# Patient Record
Sex: Male | Born: 1935 | Hispanic: Yes | Smoking: Never smoker
Health system: Southern US, Community
[De-identification: ages and names within clinical notes are randomized; demographics above are authoritative.]

## PROBLEM LIST (undated history)

## (undated) DIAGNOSIS — I2510 Atherosclerotic heart disease of native coronary artery without angina pectoris: Secondary | ICD-10-CM

## (undated) DIAGNOSIS — N189 Chronic kidney disease, unspecified: Secondary | ICD-10-CM

## (undated) DIAGNOSIS — F329 Major depressive disorder, single episode, unspecified: Secondary | ICD-10-CM

## (undated) DIAGNOSIS — I10 Essential (primary) hypertension: Secondary | ICD-10-CM

## (undated) DIAGNOSIS — F32A Depression, unspecified: Secondary | ICD-10-CM

## (undated) HISTORY — PX: BRAIN SURGERY: SHX531

---

## 2007-03-28 ENCOUNTER — Encounter: Payer: Self-pay | Admitting: Family Medicine

## 2007-04-26 ENCOUNTER — Encounter: Payer: Self-pay | Admitting: Family Medicine

## 2007-07-15 ENCOUNTER — Emergency Department: Payer: Self-pay | Admitting: Emergency Medicine

## 2007-07-24 ENCOUNTER — Emergency Department: Payer: Self-pay | Admitting: Urology

## 2008-06-24 ENCOUNTER — Emergency Department: Payer: Self-pay | Admitting: Emergency Medicine

## 2008-07-21 ENCOUNTER — Encounter: Payer: Self-pay | Admitting: Family Medicine

## 2008-07-24 ENCOUNTER — Encounter: Payer: Self-pay | Admitting: Family Medicine

## 2008-08-23 ENCOUNTER — Encounter: Payer: Self-pay | Admitting: Family Medicine

## 2008-09-23 ENCOUNTER — Encounter: Payer: Self-pay | Admitting: Family Medicine

## 2009-05-27 ENCOUNTER — Ambulatory Visit: Payer: Self-pay | Admitting: Family Medicine

## 2010-09-28 ENCOUNTER — Encounter: Payer: Self-pay | Admitting: Family Medicine

## 2014-01-27 ENCOUNTER — Ambulatory Visit: Payer: Self-pay | Admitting: Physician Assistant

## 2014-03-07 ENCOUNTER — Ambulatory Visit: Payer: Self-pay | Admitting: Physician Assistant

## 2015-06-11 ENCOUNTER — Other Ambulatory Visit: Payer: Self-pay | Admitting: Urology

## 2015-06-11 DIAGNOSIS — R339 Retention of urine, unspecified: Secondary | ICD-10-CM

## 2015-06-16 ENCOUNTER — Other Ambulatory Visit: Payer: Self-pay | Admitting: Radiology

## 2015-06-17 ENCOUNTER — Ambulatory Visit
Admission: RE | Admit: 2015-06-17 | Discharge: 2015-06-17 | Disposition: A | Discharge status: Home / Self Care | Payer: Medicare Other | Source: Ambulatory Visit | Attending: Urology | Admitting: Urology

## 2015-06-17 DIAGNOSIS — R339 Retention of urine, unspecified: Secondary | ICD-10-CM

## 2015-06-17 DIAGNOSIS — Z01812 Encounter for preprocedural laboratory examination: Secondary | ICD-10-CM | POA: Diagnosis present

## 2015-06-17 DIAGNOSIS — I129 Hypertensive chronic kidney disease with stage 1 through stage 4 chronic kidney disease, or unspecified chronic kidney disease: Secondary | ICD-10-CM | POA: Insufficient documentation

## 2015-06-17 DIAGNOSIS — I2510 Atherosclerotic heart disease of native coronary artery without angina pectoris: Secondary | ICD-10-CM | POA: Insufficient documentation

## 2015-06-17 DIAGNOSIS — N189 Chronic kidney disease, unspecified: Secondary | ICD-10-CM | POA: Diagnosis not present

## 2015-06-17 DIAGNOSIS — Z7982 Long term (current) use of aspirin: Secondary | ICD-10-CM | POA: Insufficient documentation

## 2015-06-17 DIAGNOSIS — F329 Major depressive disorder, single episode, unspecified: Secondary | ICD-10-CM | POA: Diagnosis not present

## 2015-06-17 HISTORY — DX: Essential (primary) hypertension: I10

## 2015-06-17 HISTORY — DX: Major depressive disorder, single episode, unspecified: F32.9

## 2015-06-17 HISTORY — DX: Atherosclerotic heart disease of native coronary artery without angina pectoris: I25.10

## 2015-06-17 HISTORY — DX: Depression, unspecified: F32.A

## 2015-06-17 HISTORY — DX: Chronic kidney disease, unspecified: N18.9

## 2015-06-17 LAB — BASIC METABOLIC PANEL
Anion gap: 6 (ref 5–15)
BUN: 24 mg/dL — AB (ref 6–20)
CO2: 27 mmol/L (ref 22–32)
Calcium: 7.7 mg/dL — ABNORMAL LOW (ref 8.9–10.3)
Chloride: 112 mmol/L — ABNORMAL HIGH (ref 101–111)
Creatinine, Ser: 0.82 mg/dL (ref 0.61–1.24)
GFR calc Af Amer: >60 mL/min (ref >60)
Glucose, Bld: 91 mg/dL (ref 65–99)
POTASSIUM: 3.4 mmol/L — AB (ref 3.5–5.1)
SODIUM: 145 mmol/L (ref 135–145)

## 2015-06-17 LAB — PROTIME-INR
INR: 1.36
Prothrombin Time: 16.9 seconds — ABNORMAL HIGH (ref 11.4–15.0)

## 2015-06-17 LAB — CBC
HEMATOCRIT: 35.8 % — AB (ref 40.0–52.0)
Hemoglobin: 11.5 g/dL — ABNORMAL LOW (ref 13.0–18.0)
MCH: 28.5 pg (ref 26.0–34.0)
MCHC: 32.1 g/dL (ref 32.0–36.0)
MCV: 88.7 fL (ref 80.0–100.0)
PLATELETS: 143 10*3/uL — AB (ref 150–440)
RBC: 4.04 MIL/uL — ABNORMAL LOW (ref 4.40–5.90)
RDW: 16 % — AB (ref 11.5–14.5)
WBC: 6.9 10*3/uL (ref 3.8–10.6)

## 2015-06-17 LAB — APTT: APTT: 38 s — AB (ref 24–36)

## 2015-06-17 MED ORDER — CIPROFLOXACIN IN D5W 400 MG/200ML IV SOLN
INTRAVENOUS | Status: AC
  Filled 2015-06-17: qty 200

## 2015-06-17 MED ORDER — MIDAZOLAM HCL 5 MG/5ML IJ SOLN
INTRAMUSCULAR | Status: AC
  Filled 2015-06-17: qty 5

## 2015-06-17 MED ORDER — SODIUM CHLORIDE 0.9 % IV SOLN
Freq: Once | INTRAVENOUS | Status: AC
  Administered 2015-06-17: 10:00:00 via INTRAVENOUS

## 2015-06-17 MED ORDER — FENTANYL CITRATE (PF) 100 MCG/2ML IJ SOLN
INTRAMUSCULAR | Status: AC
  Filled 2015-06-17: qty 4

## 2015-06-17 MED ORDER — CIPROFLOXACIN IN D5W 400 MG/200ML IV SOLN
400.0000 mg | Freq: Two times a day (BID) | INTRAVENOUS | Status: DC
  Administered 2015-06-17: 400 mg via INTRAVENOUS

## 2015-06-17 NOTE — Progress Notes (Signed)
Foley D/Ced after return to [facility] per order Dr. Lowella Dandy.

## 2015-06-17 NOTE — H&P (Signed)
Chief Complaint: Patient was seen in consultation today for suprapubic catheter placement at the request of Stoioff,Scott C  Referring Physician(s): Stoioff,Scott C  History of Present Illness: Gary Fisher is a 82 y.o. male with chronic urinary retention.  Patient is non verbal and accompanied by his son.  A translator was present in order to communicate with patient's son. According the son, patient has problems with urinary infections.  Patient currently has a Foley catheter and Urology has requested a suprapubic catheter.  No current problems or complaints from patient according to son.  Patient barely responds to verbal or physical interaction.  Past Medical History  Diagnosis Date  . Depression   . Hypertension   . Coronary artery disease   . Chronic kidney disease     Past Surgical History  Procedure Laterality Date  . Brain surgery      Allergies: Review of patient's allergies indicates not on file.  Medications: Prior to Admission medications   Medication Sig Start Date End Date Taking? Authorizing Provider  acetaminophen (TYLENOL) 325 MG tablet Take 650 mg by mouth every 6 (six) hours as needed for mild pain or moderate pain.   Yes Historical Provider, MD  aspirin EC 81 MG tablet Take 81 mg by mouth daily.   Yes Historical Provider, MD  citalopram (CELEXA) 20 MG tablet Take 20 mg by mouth daily.   Yes Historical Provider, MD  isosorbide mononitrate (IMDUR) 30 MG 24 hr tablet Take 15 mg by mouth daily.   Yes Historical Provider, MD  metoprolol succinate (TOPROL-XL) 25 MG 24 hr tablet Take 12.5 mg by mouth daily.   Yes Historical Provider, MD  tamsulosin (FLOMAX) 0.4 MG CAPS capsule Take 0.4 mg by mouth at bedtime.   Yes Historical Provider, MD  nitroGLYCERIN (NITROSTAT) 0.4 MG SL tablet Place 0.4 mg under the tongue every 5 (five) minutes as needed for chest pain. Reported on 06/17/2015    Historical Provider, MD     History reviewed. No pertinent family  history.  Social History   Social History  . Marital Status: Married    Spouse Name: N/A  . Number of Children: N/A  . Years of Education: N/A   Social History Main Topics  . Smoking status: Never Smoker   . Smokeless tobacco: None  . Alcohol Use: No  . Drug Use: No  . Sexual Activity: No   Other Topics Concern  . None   Social History Narrative  . None        Review of Systems  Constitutional: Negative for fever and chills.    Vital Signs: BP 128/71 mmHg  Pulse 68  Resp 16  Ht  (1.575 m)  Wt 120 lb (54.432 kg)  BMI 21.94 kg/m2  SpO2 95%  Physical Exam  Constitutional:  Non verbal.   Minimal response from physical or verbal interaction.  Cardiovascular: Normal rate and regular rhythm.   Pulmonary/Chest: Effort normal.  Faint breath sounds.  Abdominal: Soft. There is no tenderness.  Genitourinary:  Foley intact.    Mallampati Score:  MD Evaluation Airway: WNL Heart: WNL Abdomen: WNL Chest/ Lungs: WNL ASA  Classification: 2 Mallampati/Airway Score: One  Imaging: No results found.  Labs:  CBC: No results for input(s): WBC, HGB, HCT, PLT in the last 8760 hours.  COAGS: No results for input(s): INR, APTT in the last 8760 hours.  BMP: No results for input(s): NA, K, CL, CO2, GLUCOSE, BUN, CALCIUM, CREATININE, GFRNONAA, GFRAA in the last 8760  hours.  Invalid input(s): CMP  LIVER FUNCTION TESTS: No results for input(s): BILITOT, AST, ALT, ALKPHOS, PROT, ALBUMIN in the last 8760 hours.  TUMOR MARKERS: No results for input(s): AFPTM, CEA, CA199, CHROMGRNA in the last 8760 hours.  Assessment and Plan:  82 yo with chronic urinary retention.  Request for suprapubic catheter.  Discussed CT guided tube placement with son using a Nurse, learning disability.  Son understands that this procedure may be technically difficult due to patient's condition.  The foley catheter was clamped upon arrival but may need to instill sterile fluid in bladder in order to  successfully place a suprapubic catheter.  Informed consent obtained from son.  Waiting on labs.  Plan for suprapubic catheter placement in CT with moderate sedation.    Electronically Signed: Abundio Miu 06/17/2015, 10:10 AM

## 2015-06-17 NOTE — Procedures (Signed)
Placement of suprapubic catheter with CT guidance.  120 ml of sterile water was instilled through Foley in order to distend bladder.  12 French catheter placed.  Minimal blood loss and no immediate complication.

## 2015-07-01 ENCOUNTER — Other Ambulatory Visit: Payer: Self-pay | Admitting: Urology

## 2015-07-01 DIAGNOSIS — R339 Retention of urine, unspecified: Secondary | ICD-10-CM

## 2015-07-15 ENCOUNTER — Other Ambulatory Visit: Payer: Self-pay | Admitting: Urology

## 2015-07-15 ENCOUNTER — Ambulatory Visit
Admission: RE | Admit: 2015-07-15 | Discharge: 2015-07-15 | Disposition: A | Discharge status: Home / Self Care | Payer: Medicare Other | Source: Ambulatory Visit | Attending: Urology | Admitting: Urology

## 2015-07-15 DIAGNOSIS — R339 Retention of urine, unspecified: Secondary | ICD-10-CM | POA: Insufficient documentation

## 2015-07-15 DIAGNOSIS — I129 Hypertensive chronic kidney disease with stage 1 through stage 4 chronic kidney disease, or unspecified chronic kidney disease: Secondary | ICD-10-CM | POA: Diagnosis not present

## 2015-07-15 DIAGNOSIS — F329 Major depressive disorder, single episode, unspecified: Secondary | ICD-10-CM | POA: Diagnosis not present

## 2015-07-15 DIAGNOSIS — I2510 Atherosclerotic heart disease of native coronary artery without angina pectoris: Secondary | ICD-10-CM | POA: Diagnosis not present

## 2015-07-15 DIAGNOSIS — N189 Chronic kidney disease, unspecified: Secondary | ICD-10-CM | POA: Insufficient documentation

## 2015-07-15 DIAGNOSIS — Z79899 Other long term (current) drug therapy: Secondary | ICD-10-CM | POA: Diagnosis not present

## 2015-07-15 MED ORDER — LIDOCAINE HCL (PF) 1 % IJ SOLN
INTRAMUSCULAR | Status: DC | PRN
  Administered 2015-07-15: 4 mL

## 2015-07-15 MED ORDER — IOHEXOL 300 MG/ML  SOLN
30.0000 mL | Freq: Once | INTRAMUSCULAR | Status: AC | PRN
  Administered 2015-07-15: 4 mL

## 2015-07-15 NOTE — Sedation Documentation (Signed)
#  16 multipurpose drain inserted

## 2015-08-14 ENCOUNTER — Ambulatory Visit
Admission: RE | Admit: 2015-08-14 | Discharge: 2015-08-14 | Disposition: A | Discharge status: Home / Self Care | Payer: Medicare Other | Source: Ambulatory Visit | Attending: Urology | Admitting: Urology

## 2015-08-14 NOTE — OR Nursing (Signed)
Attempted to call to find out why not here. No answer.

## 2015-08-23 ENCOUNTER — Inpatient Hospital Stay
Admission: EM | Admit: 2015-08-23 | Discharge: 2015-08-25 | DRG: 640 | Payer: Medicare Other | Attending: Internal Medicine | Admitting: Internal Medicine

## 2015-08-23 DIAGNOSIS — T83028A Displacement of other indwelling urethral catheter, initial encounter: Secondary | ICD-10-CM | POA: Diagnosis present

## 2015-08-23 DIAGNOSIS — I2510 Atherosclerotic heart disease of native coronary artery without angina pectoris: Secondary | ICD-10-CM | POA: Diagnosis present

## 2015-08-23 DIAGNOSIS — R338 Other retention of urine: Secondary | ICD-10-CM

## 2015-08-23 DIAGNOSIS — N390 Urinary tract infection, site not specified: Secondary | ICD-10-CM | POA: Diagnosis present

## 2015-08-23 DIAGNOSIS — R1310 Dysphagia, unspecified: Secondary | ICD-10-CM | POA: Diagnosis present

## 2015-08-23 DIAGNOSIS — L8990 Pressure ulcer of unspecified site, unspecified stage: Secondary | ICD-10-CM | POA: Diagnosis present

## 2015-08-23 DIAGNOSIS — Z66 Do not resuscitate: Secondary | ICD-10-CM | POA: Diagnosis present

## 2015-08-23 DIAGNOSIS — Z681 Body mass index (BMI) 19 or less, adult: Secondary | ICD-10-CM | POA: Diagnosis not present

## 2015-08-23 DIAGNOSIS — Z7982 Long term (current) use of aspirin: Secondary | ICD-10-CM | POA: Diagnosis not present

## 2015-08-23 DIAGNOSIS — E43 Unspecified severe protein-calorie malnutrition: Secondary | ICD-10-CM | POA: Diagnosis present

## 2015-08-23 DIAGNOSIS — N189 Chronic kidney disease, unspecified: Secondary | ICD-10-CM | POA: Diagnosis present

## 2015-08-23 DIAGNOSIS — E870 Hyperosmolality and hypernatremia: Secondary | ICD-10-CM | POA: Diagnosis present

## 2015-08-23 DIAGNOSIS — Z79899 Other long term (current) drug therapy: Secondary | ICD-10-CM | POA: Diagnosis not present

## 2015-08-23 DIAGNOSIS — D696 Thrombocytopenia, unspecified: Secondary | ICD-10-CM | POA: Diagnosis present

## 2015-08-23 DIAGNOSIS — Z515 Encounter for palliative care: Secondary | ICD-10-CM | POA: Diagnosis present

## 2015-08-23 DIAGNOSIS — M24561 Contracture, right knee: Secondary | ICD-10-CM | POA: Diagnosis present

## 2015-08-23 DIAGNOSIS — I129 Hypertensive chronic kidney disease with stage 1 through stage 4 chronic kidney disease, or unspecified chronic kidney disease: Secondary | ICD-10-CM | POA: Diagnosis present

## 2015-08-23 DIAGNOSIS — L97529 Non-pressure chronic ulcer of other part of left foot with unspecified severity: Secondary | ICD-10-CM | POA: Diagnosis present

## 2015-08-23 DIAGNOSIS — F03918 Unspecified dementia, unspecified severity, with other behavioral disturbance: Secondary | ICD-10-CM

## 2015-08-23 DIAGNOSIS — R4701 Aphasia: Secondary | ICD-10-CM | POA: Diagnosis present

## 2015-08-23 DIAGNOSIS — I959 Hypotension, unspecified: Secondary | ICD-10-CM | POA: Diagnosis present

## 2015-08-23 DIAGNOSIS — R627 Adult failure to thrive: Secondary | ICD-10-CM | POA: Diagnosis present

## 2015-08-23 DIAGNOSIS — R6251 Failure to thrive (child): Secondary | ICD-10-CM

## 2015-08-23 DIAGNOSIS — R339 Retention of urine, unspecified: Secondary | ICD-10-CM

## 2015-08-23 DIAGNOSIS — F0391 Unspecified dementia with behavioral disturbance: Secondary | ICD-10-CM | POA: Diagnosis present

## 2015-08-23 DIAGNOSIS — M24562 Contracture, left knee: Secondary | ICD-10-CM | POA: Diagnosis present

## 2015-08-23 DIAGNOSIS — E860 Dehydration: Secondary | ICD-10-CM | POA: Diagnosis present

## 2015-08-23 DIAGNOSIS — R532 Functional quadriplegia: Secondary | ICD-10-CM | POA: Diagnosis present

## 2015-08-23 DIAGNOSIS — Z9889 Other specified postprocedural states: Secondary | ICD-10-CM | POA: Diagnosis not present

## 2015-08-23 LAB — COMPREHENSIVE METABOLIC PANEL
ALBUMIN: 2.8 g/dL — AB (ref 3.5–5.0)
ALT: 14 U/L — ABNORMAL LOW (ref 17–63)
ANION GAP: 10 (ref 5–15)
AST: 31 U/L (ref 15–41)
Alkaline Phosphatase: 73 U/L (ref 38–126)
BUN: 46 mg/dL — ABNORMAL HIGH (ref 6–20)
CO2: 25 mmol/L (ref 22–32)
Calcium: 8.9 mg/dL (ref 8.9–10.3)
Chloride: 121 mmol/L — ABNORMAL HIGH (ref 101–111)
Creatinine, Ser: 1.16 mg/dL (ref 0.61–1.24)
GFR calc Af Amer: 59 mL/min — ABNORMAL LOW (ref >60)
GFR calc non Af Amer: 51 mL/min — ABNORMAL LOW (ref >60)
GLUCOSE: 92 mg/dL (ref 65–99)
POTASSIUM: 4.7 mmol/L (ref 3.5–5.1)
SODIUM: 156 mmol/L — AB (ref 135–145)
Total Bilirubin: 0.7 mg/dL (ref 0.3–1.2)
Total Protein: 6.1 g/dL — ABNORMAL LOW (ref 6.5–8.1)

## 2015-08-23 LAB — CBC WITH DIFFERENTIAL/PLATELET
BASOS PCT: 0 %
Basophils Absolute: 0 10*3/uL (ref 0–0.1)
EOS PCT: 4 %
Eosinophils Absolute: 0.4 10*3/uL (ref 0–0.7)
HCT: 42.6 % (ref 40.0–52.0)
HEMOGLOBIN: 13.6 g/dL (ref 13.0–18.0)
LYMPHS PCT: 36 %
Lymphs Abs: 3.6 10*3/uL (ref 1.0–3.6)
MCH: 28.1 pg (ref 26.0–34.0)
MCHC: 32 g/dL (ref 32.0–36.0)
MCV: 87.8 fL (ref 80.0–100.0)
MONO ABS: 0.7 10*3/uL (ref 0.2–1.0)
MONOS PCT: 7 %
NEUTROS ABS: 5.3 10*3/uL (ref 1.4–6.5)
Neutrophils Relative %: 53 %
Platelets: 121 10*3/uL — ABNORMAL LOW (ref 150–440)
RBC: 4.86 MIL/uL (ref 4.40–5.90)
RDW: 17.7 % — ABNORMAL HIGH (ref 11.5–14.5)
WBC: 10 10*3/uL (ref 3.8–10.6)

## 2015-08-23 LAB — URINALYSIS COMPLETE WITH MICROSCOPIC (ARMC ONLY)
Bilirubin Urine: NEGATIVE
Glucose, UA: NEGATIVE mg/dL
Ketones, ur: NEGATIVE mg/dL
Leukocytes, UA: NEGATIVE
NITRITE: NEGATIVE
PH: 8 (ref 5.0–8.0)
PROTEIN: 100 mg/dL — AB
Specific Gravity, Urine: 1.015 (ref 1.005–1.030)
Squamous Epithelial / LPF: NONE SEEN

## 2015-08-23 LAB — MRSA PCR SCREENING: MRSA BY PCR: NEGATIVE

## 2015-08-23 MED ORDER — DEXTROSE 5 % IV SOLN
INTRAVENOUS | Status: DC
  Administered 2015-08-23 – 2015-08-24 (×4): via INTRAVENOUS

## 2015-08-23 MED ORDER — TAMSULOSIN HCL 0.4 MG PO CAPS: 0.4000 mg | ORAL_CAPSULE | Freq: Every day | ORAL | Status: DC

## 2015-08-23 MED ORDER — PRO-STAT SUGAR FREE PO LIQD: 30.0000 mL | Freq: Every day | ORAL | Status: DC

## 2015-08-23 MED ORDER — ACETAMINOPHEN 325 MG PO TABS: 650.0000 mg | ORAL_TABLET | Freq: Four times a day (QID) | ORAL | Status: DC | PRN

## 2015-08-23 MED ORDER — BISACODYL 5 MG PO TBEC: 5.0000 mg | DELAYED_RELEASE_TABLET | Freq: Every day | ORAL | Status: DC | PRN

## 2015-08-23 MED ORDER — SODIUM CHLORIDE 0.9 % IV SOLN
Freq: Once | INTRAVENOUS | Status: AC
  Administered 2015-08-23: 13:00:00 via INTRAVENOUS

## 2015-08-23 MED ORDER — ISOSORBIDE DINITRATE 5 MG PO TABS: 15.0000 mg | ORAL_TABLET | Freq: Every day | ORAL | Status: DC

## 2015-08-23 MED ORDER — HEPARIN SODIUM (PORCINE) 5000 UNIT/ML IJ SOLN
5000.0000 [IU] | Freq: Three times a day (TID) | INTRAMUSCULAR | Status: DC
  Administered 2015-08-23 – 2015-08-25 (×5): 5000 [IU] via SUBCUTANEOUS
  Filled 2015-08-23 (×5): qty 1

## 2015-08-23 MED ORDER — ASPIRIN EC 81 MG PO TBEC: 81.0000 mg | DELAYED_RELEASE_TABLET | Freq: Every day | ORAL | Status: DC

## 2015-08-23 MED ORDER — PIPERACILLIN-TAZOBACTAM 3.375 G IVPB
3.3750 g | Freq: Three times a day (TID) | INTRAVENOUS | Status: DC
  Administered 2015-08-23 – 2015-08-24 (×2): 3.375 g via INTRAVENOUS
  Filled 2015-08-23 (×4): qty 50

## 2015-08-23 MED ORDER — ONDANSETRON HCL 4 MG/2ML IJ SOLN: 4.0000 mg | Freq: Four times a day (QID) | INTRAMUSCULAR | Status: DC | PRN

## 2015-08-23 MED ORDER — DEXTROSE 5 % IV SOLN
1.0000 g | INTRAVENOUS | Status: DC
  Administered 2015-08-23: 1 g via INTRAVENOUS
  Filled 2015-08-23 (×2): qty 10

## 2015-08-23 MED ORDER — TRAZODONE HCL 50 MG PO TABS: 25.0000 mg | ORAL_TABLET | Freq: Every evening | ORAL | Status: DC | PRN

## 2015-08-23 MED ORDER — ADULT MULTIVITAMIN W/MINERALS CH: 1.0000 | ORAL_TABLET | Freq: Every day | ORAL | Status: DC

## 2015-08-23 MED ORDER — DOCUSATE SODIUM 100 MG PO CAPS: 100.0000 mg | ORAL_CAPSULE | Freq: Two times a day (BID) | ORAL | Status: DC

## 2015-08-23 MED ORDER — ACETAMINOPHEN 650 MG RE SUPP: 650.0000 mg | Freq: Four times a day (QID) | RECTAL | Status: DC | PRN

## 2015-08-23 MED ORDER — ONDANSETRON HCL 4 MG PO TABS: 4.0000 mg | ORAL_TABLET | Freq: Four times a day (QID) | ORAL | Status: DC | PRN

## 2015-08-23 MED ORDER — MIRTAZAPINE 15 MG PO TABS: 15.0000 mg | ORAL_TABLET | Freq: Every day | ORAL | Status: DC

## 2015-08-23 NOTE — Consult Note (Signed)
Consult: Urinary retention, SP tube placement Requested by: Dr. Darnelle CatalanMalinda  Chief Complaint: Urinary retention  History of Present Illness: 82 year old non-verbal patient of Dr. Lonna CobbStoioff with dementia, contractures, failure to thrive and history of urinary retention. He underwent suprapubic tube placement by IR February 2017. This was upsized to a 16 [place] catheter by IR March 2017. At some point recently, the suprapubic tube was dislodged at pt nursing home. Dr. Darnelle CatalanMalinda tried to reinsert earlier today in ED but could only get the SP tube and about a centimeter. Therefore a Foley catheter was placed and is draining urine. The patient was admitted with hypernatremia in addition to the above diagnoses. Per the nurse a palliative care consult is pending for tomorrow.  Past Medical History  Diagnosis Date  . Depression   . Hypertension   . Coronary artery disease   . Chronic kidney disease    Past Surgical History  Procedure Laterality Date  . Brain surgery      Home Medications:  Prescriptions prior to admission  Medication Sig Dispense Refill Last Dose  . Amino Acids-Protein Hydrolys (FEEDING SUPPLEMENT, PRO-STAT SUGAR FREE 64,) LIQD Take 30 mLs by mouth daily.   08/23/2015 at Unknown time  . aspirin EC 81 MG tablet Take 81 mg by mouth daily.   08/23/2015 at Unknown time  . isosorbide dinitrate (ISORDIL) 30 MG tablet Take 15 mg by mouth daily.   08/23/2015 at Unknown time  . mirtazapine (REMERON) 15 MG tablet Take 15 mg by mouth at bedtime.   08/22/2015 at Unknown time  . Multiple Vitamins-Minerals (MULTIVITAMIN WITH MINERALS) tablet Take 1 tablet by mouth daily.   08/23/2015 at Unknown time  . tamsulosin (FLOMAX) 0.4 MG CAPS capsule Take 0.4 mg by mouth daily.    08/23/2015 at Unknown time   Allergies: No Known Allergies  No family history on file. Social History:  reports that he has never smoked. He does not have any smokeless tobacco history on file. He reports that he does not drink alcohol  or use illicit drugs.  ROS: A complete review of systems was performed.  All systems are negative except for pertinent findings as noted. ROS   Physical Exam:  Vital signs in last 24 hours: Temp:  [96.8 F (36 C)-97.6 F (36.4 C)] 97.6 F (36.4 C) (04/30 1523) Pulse Rate:  [63-125] 93 (04/30 1455) Resp:  [16] 16 (04/30 1316) BP: (105-134)/(48-96) 109/96 mmHg (04/30 1455) SpO2:  [90 %-100 %] 90 % (04/30 1455) Weight:  [45.36 kg (100 lb)] 45.36 kg (100 lb) (04/30 0911) General:  Alert, not oriented. Elderly and cachectic. HEENT: Normocephalic, atraumatic Abdomen: Soft, nontender, nondistended, no abdominal masses. SP tube site is tight and granulated. Back: No CVA tenderness Extremities: No edema Neurologic: Grossly intact GU: foley in place draining dark, but clear urine.  Procedure: With the help of the outgoing and incoming nurse, the patient's suprapubic tube site was prepped with Betadine and I tried to pass a 14 French catheter but could not get it started.  Laboratory Data:  Results for orders placed or performed during the hospital encounter of 08/23/15 (from the past 24 hour(s))  Comprehensive metabolic panel     Status: Abnormal   Collection Time: 08/23/15  9:52 AM  Result Value Ref Range   Sodium 156 (H) 135 - 145 mmol/L   Potassium 4.7 3.5 - 5.1 mmol/L   Chloride 121 (H) 101 - 111 mmol/L   CO2 25 22 - 32 mmol/L   Glucose, Bld 92  65 - 99 mg/dL   BUN 46 (H) 6 - 20 mg/dL   Creatinine, Ser 9.60 0.61 - 1.24 mg/dL   Calcium 8.9 8.9 - 45.4 mg/dL   Total Protein 6.1 (L) 6.5 - 8.1 g/dL   Albumin 2.8 (L) 3.5 - 5.0 g/dL   AST 31 15 - 41 U/L   ALT 14 (L) 17 - 63 U/L   Alkaline Phosphatase 73 38 - 126 U/L   Total Bilirubin 0.7 0.3 - 1.2 mg/dL   GFR calc non Af Amer 51 (L) >60 mL/min   GFR calc Af Amer 59 (L) >60 mL/min   Anion gap 10 5 - 15  CBC with Differential     Status: Abnormal   Collection Time: 08/23/15  9:52 AM  Result Value Ref Range   WBC 10.0 3.8 - 10.6  K/uL   RBC 4.86 4.40 - 5.90 MIL/uL   Hemoglobin 13.6 13.0 - 18.0 g/dL   HCT 09.8 11.9 - 14.7 %   MCV 87.8 80.0 - 100.0 fL   MCH 28.1 26.0 - 34.0 pg   MCHC 32.0 32.0 - 36.0 g/dL   RDW 82.9 (H) 56.2 - 13.0 %   Platelets 121 (L) 150 - 440 K/uL   Neutrophils Relative % 53 %   Lymphocytes Relative 36 %   Monocytes Relative 7 %   Eosinophils Relative 4 %   Basophils Relative 0 %   Neutro Abs 5.3 1.4 - 6.5 K/uL   Lymphs Abs 3.6 1.0 - 3.6 K/uL   Monocytes Absolute 0.7 0.2 - 1.0 K/uL   Eosinophils Absolute 0.4 0 - 0.7 K/uL   Basophils Absolute 0.0 0 - 0.1 K/uL   Smear Review MORPHOLOGY UNREMARKABLE   Urinalysis complete, with microscopic     Status: Abnormal   Collection Time: 08/23/15 12:22 PM  Result Value Ref Range   Color, Urine AMBER (A) YELLOW   APPearance HAZY (A) CLEAR   Glucose, UA NEGATIVE NEGATIVE mg/dL   Bilirubin Urine NEGATIVE NEGATIVE   Ketones, ur NEGATIVE NEGATIVE mg/dL   Specific Gravity, Urine 1.015 1.005 - 1.030   Hgb urine dipstick 2+ (A) NEGATIVE   pH 8.0 5.0 - 8.0   Protein, ur 100 (A) NEGATIVE mg/dL   Nitrite NEGATIVE NEGATIVE   Leukocytes, UA NEGATIVE NEGATIVE   RBC / HPF TOO NUMEROUS TO COUNT 0 - 5 RBC/hpf   WBC, UA TOO NUMEROUS TO COUNT 0 - 5 WBC/hpf   Bacteria, UA MANY (A) NONE SEEN   Squamous Epithelial / LPF NONE SEEN NONE SEEN  MRSA PCR Screening     Status: None   Collection Time: 08/23/15  3:36 PM  Result Value Ref Range   MRSA by PCR NEGATIVE NEGATIVE   Recent Results (from the past 240 hour(s))  MRSA PCR Screening     Status: None   Collection Time: 08/23/15  3:36 PM  Result Value Ref Range Status   MRSA by PCR NEGATIVE NEGATIVE Final    Comment:        The GeneXpert MRSA Assay (FDA approved for NASAL specimens only), is one component of a comprehensive MRSA colonization surveillance program. It is not intended to diagnose MRSA infection nor to guide or monitor treatment for MRSA infections.    Creatinine:  Recent Labs   08/23/15 0952  CREATININE 1.16    Impression/Assessment/plan: Urinary retention-patient with Foley catheter draining urine. If palliative care consult is pending then it might be beneficial for them to develop a long-term plan with pt's  son. If the patient is expected to live a few more days/weeks then the Foley catheter may be sufficient as it only needs to be changed every 4 weeks. If pt is expected to live for months, IR could attempt to re-access the suprapubic site and place a new suprapubic tube. I will sign off for now, but if long term plan is developed and GU assistance is needed please page urologist on call.   Wyatt Thorstenson 08/23/2015, 8:00 PM

## 2015-08-23 NOTE — ED Notes (Signed)
Attempted to call [facility], no answer. Unable to leave VM. Will continue to call.

## 2015-08-23 NOTE — H&P (Signed)
[facility] - [place] [facility]   PATIENT NAME: Gary Fisher    MR#:  [id number]  DATE OF BIRTH:  09/02/1917  DATE OF ADMISSION:  08/23/2015  PRIMARY CARE PHYSICIAN: Derwood Kaplan, MD   REQUESTING/REFERRING PHYSICIAN: Dr. Dorothea Glassman  CHIEF COMPLAINT: Pulled out suprapubic catheter   No chief complaint on file.   HISTORY OF PRESENT ILLNESS:  Gary Fisher  is a 83 y.o. male with a known history of dementia, hypertension, depression, chronic urinary retention with suprapubic catheter sent in from [facility] as patient pulled out suprapubic catheter but the unknown to the staff when patient pulled out the suprapubic catheter. Patient is very nourished, has contractures of lower extremities and confused so unable to get any history from him. History obtained from ER physician. We'll call [name] also to discuss what happened there. Unable to get any history from patient as he is very demented. Past Medical History  Diagnosis Date  . Depression   . Hypertension   . Coronary artery disease   . Chronic kidney disease     PAST SURGICAL HISTOIRY:   Past Surgical History  Procedure Laterality Date  . Brain surgery      SOCIAL HISTORY:   Social History  Substance Use Topics  . Smoking status: Never Smoker   . Smokeless tobacco: Not on file  . Alcohol Use: No    FAMILY HISTORY:  No family history on file.  DRUG ALLERGIES:  No Known Allergies  REVIEW OF SYSTEMS:Unable To obtain because of dementia.    MEDICATIONS AT HOME:   Prior to Admission medications   Medication Sig Start Date End Date Taking? Authorizing Provider  Amino Acids-Protein Hydrolys (FEEDING SUPPLEMENT, PRO-STAT SUGAR FREE 64,) LIQD Take 30 mLs by mouth daily.   Yes Historical Provider, MD  aspirin EC 81 MG tablet Take 81 mg by mouth daily.   Yes Historical Provider, MD  isosorbide dinitrate (ISORDIL) 30 MG tablet Take  15 mg by mouth daily.   Yes Historical Provider, MD  mirtazapine (REMERON) 15 MG tablet Take 15 mg by mouth at bedtime.   Yes Historical Provider, MD  Multiple Vitamins-Minerals (MULTIVITAMIN WITH MINERALS) tablet Take 1 tablet by mouth daily.   Yes Historical Provider, MD  tamsulosin (FLOMAX) 0.4 MG CAPS capsule Take 0.4 mg by mouth daily.    Yes Historical Provider, MD      VITAL SIGNS:  Blood pressure 117/69, pulse 125, temperature 96.8 F (36 C), temperature source Rectal, weight 45.36 kg (100 lb), SpO2 100 %.  PHYSICAL EXAMINATION:  GENERAL:  82 y.o.-year-old patient lying in the bed ,severely malnourished,. EYES: Pupils equal, round, reactive to light and accommodation. No scleral icterus. Extraocular muscles intact.  HEENT: Head atraumatic, normocephalic. Oropharynx and nasopharynx clear.  NECK:  Supple, no jugular venous distention. No thyroid enlargement, no tenderness.  LUNGS: Normal breath sounds bilaterally, no wheezing, rales,rhonchi or crepitation. No use of accessory muscles of respiration.  CARDIOVASCULAR: S1, S2 normal. No murmurs, rubs, or gallops.  ABDOMEN: Soft, nontender, nondistended. Bowel sounds present. No organomegaly or mass.  EXTREMITIES: Lower extremity contractures present.  NEUROLOGIC: Unable to follow commands due to dementia so we could not do the neurological examination.  PSYCHIATRIC: The patient is alert and oriented x 3.  SKIN: No obvious rash, lesion, or ulcer.   LABORATORY PANEL:   CBC  Recent Labs Lab 08/23/15 0952  WBC 10.0  HGB 13.6  HCT 42.6  PLT  121*   ------------------------------------------------------------------------------------------------------------------  Chemistries   Recent Labs Lab 08/23/15 0952  NA 156*  K 4.7  CL 121*  CO2 25  GLUCOSE 92  BUN 46*  CREATININE 1.16  CALCIUM 8.9  AST 31  ALT 14*  ALKPHOS 73  BILITOT 0.7    ------------------------------------------------------------------------------------------------------------------  Cardiac Enzymes No results for input(s): TROPONINI in the last 168 hours. ------------------------------------------------------------------------------------------------------------------  RADIOLOGY:  No results found.  EKG:  No orders found for this or any previous visit.  EKG Not done. In ER   IMPRESSION AND PLAN:  1.acute hypernatremia;due to dehydration ,poor po intake;continue  Aggressive hydration, admitted to the hospital. #2 severe malnutrition: Will do calorie count, dietitian consult, ; continue the prostat.   3. UTI, chronic urinary retention: Patient will be started on Rocephin IV, follow urine cultures. We will request interventional radiology to reinsert the suprapubic catheter. Patient original suprapubic cath was inserted by interventional radiology under CT guided drainage in February of this year. Consult urology also, urine retention patient is on Flomax, continue that. #4 left foot ulcer #5 failure to thrive; consult palliative care to discuss DO NOT RESUSCITATE status and goals. #6 essential hypertension: Patient now has hypotension, hold BP medicine namely metoprolol, Imdur. I reviewed the records in Epic, spent about 30 minutes in reviewing the records, total time spent is more than 55 minutes on this admission. All the records are reviewed and case discussed with ED provider. Management plans discussed with the patient, family and they are in agreement.  CODE STATUS: full  TOTAL TIME TAKING CARE OF THIS PATIENT: 55 minutes.    Katha HammingKONIDENA,Benoit Meech M.D on 08/23/2015 at 1:07 PM  Between 7am to 6pm - Pager - [phone]  After 6pm go to [email or web address] - password [email or web address] [facility]  [facility]  Office  [phone]  CC: Primary care physician; Derwood KaplanEason,  Ernest B, MD  Note: This dictation was prepared with Dragon dictation along  with smaller phrase technology. Any transcriptional errors that result from this process are unintentional.

## 2015-08-23 NOTE — [place] FL2 (Signed)
[place] MEDICAID FL2 LEVEL OF CARE SCREENING TOOL     IDENTIFICATION  Patient Name: Gary Fisher Birthdate: 01/27/1918 Sex: male Admission Date (Current Location): 08/23/2015  [place]   Facility and Address:  [facility], [address], [place], [place] [address]      Provider Number: [id number]  Attending Physician Name and Address:  Katha HammingSnehalatha Konidena, MD  Relative Name and Phone Number:       Current Level of Care: Hospital Recommended Level of Care: Skilled Nursing Facility Prior Approval Number:    Date Approved/Denied:   PASRR Number:    Discharge Plan: SNF    Current Diagnoses: Patient Active Problem List   Diagnosis Date Noted  . Acute hypernatremia 08/23/2015    Orientation RESPIRATION BLADDER Height & Weight        Normal Indwelling catheter Weight: 100 lb (45.36 kg) Height:     BEHAVIORAL SYMPTOMS/MOOD NEUROLOGICAL BOWEL NUTRITION STATUS  Other (Comment) (Dementia)   Incontinent Diet (Normal)  AMBULATORY STATUS COMMUNICATION OF NEEDS Skin   Extensive Assist Does not communicate Normal                       Personal Care Assistance Level of Assistance  Bathing, Feeding, Dressing, Total care Bathing Assistance: Maximum assistance Feeding assistance: Maximum assistance Dressing Assistance: Maximum assistance Total Care Assistance: Maximum assistance   Functional Limitations Info  Sight, Hearing, Speech Sight Info: Adequate Hearing Info: Adequate Speech Info: Impaired ([place])    SPECIAL CARE FACTORS FREQUENCY                       Contractures Contractures Info: Present (Lower half of abdomen)    Additional Factors Info                  Current Medications (08/23/2015):  This is the current hospital active medication list Current Facility-Administered Medications  Medication Dose Route Frequency Provider Last Rate Last Dose  . acetaminophen (TYLENOL) tablet  650 mg  650 mg Oral Q6H PRN Katha HammingSnehalatha Konidena, MD       Or  . acetaminophen (TYLENOL) suppository 650 mg  650 mg Rectal Q6H PRN Katha HammingSnehalatha Konidena, MD      . aspirin EC tablet 81 mg  81 mg Oral Daily Katha HammingSnehalatha Konidena, MD      . bisacodyl (DULCOLAX) EC tablet 5 mg  5 mg Oral Daily PRN Katha HammingSnehalatha Konidena, MD      . cefTRIAXone (ROCEPHIN) 1 g in dextrose 5 % 50 mL IVPB  1 g Intravenous Q24H Katha HammingSnehalatha Konidena, MD 100 mL/hr at 08/23/15 1322 1 g at 08/23/15 1322  . dextrose 5 % solution   Intravenous Continuous Katha HammingSnehalatha Konidena, MD      . docusate sodium (COLACE) capsule 100 mg  100 mg Oral BID Katha HammingSnehalatha Konidena, MD      . feeding supplement (PRO-STAT SUGAR FREE 64) liquid 30 mL  30 mL Oral Daily Katha HammingSnehalatha Konidena, MD      . heparin injection 5,000 Units  5,000 Units Subcutaneous Q8H Katha HammingSnehalatha Konidena, MD      . mirtazapine (REMERON) tablet 15 mg  15 mg Oral QHS Katha HammingSnehalatha Konidena, MD      . multivitamin with minerals tablet 1 tablet  1 tablet Oral Daily Katha HammingSnehalatha Konidena, MD      . ondansetron (ZOFRAN) tablet 4 mg  4 mg Oral Q6H PRN Katha HammingSnehalatha Konidena, MD       Or  .  ondansetron (ZOFRAN) injection 4 mg  4 mg Intravenous Q6H PRN Katha Hamming, MD      . tamsulosin (FLOMAX) capsule 0.4 mg  0.4 mg Oral Daily Katha Hamming, MD      . traZODone (DESYREL) tablet 25 mg  25 mg Oral QHS PRN Katha Hamming, MD         Discharge Medications: Please see discharge summary for a list of discharge medications.  Relevant Imaging Results:  Relevant Lab Results:   Additional Information  SSN # [id number]  Cheron Schaumann, [place]

## 2015-08-23 NOTE — ED Notes (Signed)
Attempted to call report. RN on unit unable to take report at this time. Will return call to receive report per unit.

## 2015-08-23 NOTE — ED Provider Notes (Signed)
[facility] Emergency Department Provider Note   ____________________________________________  Time seen: Approximately 11:52 AM  I have reviewed the triage vital signs and the nursing notes.   HISTORY  Chief Complaint No chief complaint on file.  Chief complaint is suprapubic catheter came out  History limited by patient not speaking  HPI Gary Fisher is a 82 y.o. male who comes from [facility]. Staff there reports his suprapubic catheter is pulled out. Were not sure when it came out. Patient was reported by EMS to smell very strongly of urine, the bed was soaked with urine when he got there [facility] was engaged in getting the patient a bath at the time. Patient is contracted and mumbles but does not speak. This is apparently his baseline. We have been unable to contact [facility] since patient got here.   Past Medical History  Diagnosis Date  . Depression   . Hypertension   . Coronary artery disease   . Chronic kidney disease     Patient Active Problem List   Diagnosis Date Noted  . Acute hypernatremia 08/23/2015    Past Surgical History  Procedure Laterality Date  . Brain surgery      No current outpatient prescriptions on file.  Allergies Review of patient's allergies indicates no known allergies.  No family history on file.  Social History Social History  Substance Use Topics  . Smoking status: Never Smoker   . Smokeless tobacco: Not on file  . Alcohol Use: No    Review of Systems Unable to obtain due to patient not speaking ____________________________________________   PHYSICAL EXAM:  VITAL SIGNS: ED Triage Vitals  Enc Vitals Group     BP 08/23/15 0911 112/54 mmHg     Pulse Rate 08/23/15 0911 91     Resp --      Temp 08/23/15 0911 96.8 F (36 C)     Temp Source 08/23/15 0911 Rectal     SpO2 08/23/15 0911 99 %     Weight 08/23/15 0911 100 lb (45.36 kg)     Height --      Head  Cir --      Peak Flow --      Pain Score --      Pain Loc --      Pain Edu? --      Excl. in GC? --     Constitutional: Alert And mumbles at times Eyes: Conjunctivae are normal. PERRL. EOMI. Head: Atraumatic. Nose: No congestion/rhinnorhea. Mouth/Throat: Mucous membranes are dry Neck: No stridor.   Cardiovascular: Normal rate, regular rhythm. Grossly normal heart sounds.  Good peripheral circulation. Respiratory: Normal respiratory effort.  No retractions. Lungs CTAB. Gastrointestinal: Soft and nontender abdomen is scaphoid. No distention. No abdominal bruits. No CVA tenderness. Patient has a suprapubic catheter for at least the spot where the catheter came out of. Genitourinary:  Musculoskeletal: No lower extremity tenderness nor edema.  No joint effusions. Neurologic:  Normal speech and language. No gross focal neurologic deficits are appreciated. No gait instability. Skin:  Skin is warm, dry and intact. No rash noted.   ____________________________________________   LABS (all labs ordered are listed, but only abnormal results are displayed)  Labs Reviewed  URINALYSIS COMPLETEWITH MICROSCOPIC ([facility] ONLY) - Abnormal; Notable for the following:    Color, Urine AMBER (*)    APPearance HAZY (*)    Hgb urine dipstick 2+ (*)    Protein, ur 100 (*)  Bacteria, UA MANY (*)    All other components within normal limits  COMPREHENSIVE METABOLIC PANEL - Abnormal; Notable for the following:    Sodium 156 (*)    Chloride 121 (*)    BUN 46 (*)    Total Protein 6.1 (*)    Albumin 2.8 (*)    ALT 14 (*)    GFR calc non Af Amer 51 (*)    GFR calc Af Amer 59 (*)    All other components within normal limits  CBC WITH DIFFERENTIAL/PLATELET - Abnormal; Notable for the following:    RDW 17.7 (*)    Platelets 121 (*)    All other components within normal limits  URINE CULTURE  URINE CULTURE    ____________________________________________  EKG   ____________________________________________  RADIOLOGY   ____________________________________________   PROCEDURES  Procedure(s) performed: Unable to obtain consent but since patient had the catheter pulled out we attempted to put it back in his skin was cleaned and then prepped. Lubricant was instilled into the catheter site syringe and then put on the catheter as well. Attempted to insert the catheter however were unable to get it to penetrate more than half an inch into the abdomen.  Discussed patient with urology on-call. They recommended trying to put in a Foley. Interventional radiology actually put in a suprapubic catheter in February. He recommends getting interventional the Foley catheter in tomorrow.  ____________________________________________   INITIAL IMPRESSION / ASSESSMENT AND PLAN / ED COURSE  Pertinent labs & imaging results that were available during my care of the patient were reviewed by me and considered in my medical decision making (see chart for details).  ____________________________________________   FINAL CLINICAL IMPRESSION(S) / ED DIAGNOSES  Final diagnoses:  Hypernatremia  Dehydration, severe      NEW MEDICATIONS STARTED DURING THIS VISIT:  Current Discharge Medication List       Note:  This document was prepared using Dragon voice recognition software and may include unintentional dictation errors.    Arnaldo Natal, MD 08/23/15 [phone]

## 2015-08-23 NOTE — Progress Notes (Signed)
Pharmacy Antibiotic Note  Gary Fisher is a 82 y.o. male admitted on 08/23/2015 with UTI.  Pharmacy has been consulted for Zosyn dosing.  Plan: Zosyn 3.375 gm IV Q8H EI  Weight: 100 lb (45.36 kg)  Temp (24hrs), Avg:97.6 F (36.4 C), Min:96.8 F (36 C), Max:98.3 F (36.8 C)   Recent Labs Lab 08/23/15 0952  WBC 10.0  CREATININE 1.16    Estimated Creatinine Clearance: 23.9 mL/min (by C-G formula based on Cr of 1.16).    No Known Allergies   Thank you for allowing pharmacy to be a part of this patient's care.  Carola FrostNathan A Cherylin Waguespack, Pharm.D., BCPS Clinical Pharmacist 08/23/2015 10:25 PM

## 2015-08-23 NOTE — ED Notes (Signed)
Pt sent from [facility] by EMS reports suprapubic catheter was pull. Unknown to staff when the catheter was removed. Pt alert NAD

## 2015-08-23 NOTE — Clinical Social Work Note (Signed)
Clinical Social Work Assessment  Patient Details  Name: Gary Fisher MRN: [id number] Date of Birth: 11/06/1917  Date of referral:  08/23/15               Reason for consult:  Facility Placement                Permission sought to share information with:  Family Supports, Magazine features editoracility Contact Representative Permission granted to share information::  Yes, Verbal Permission Granted  Name::     Son Cipriano BunkerFelipe Aguayo [phone]  Agency::  yes  Relationship::  yes  Contact Information:  yes  Housing/Transportation Living arrangements for the past 2 months:  Skilled Nursing Facility Source of Information:  Other (Comment Required) (facility and family) Patient Interpreter Needed:  Spanish Criminal Activity/Legal Involvement Pertinent to Current Situation/Hospitalization:  No - Comment as needed Significant Relationships:  Adult Children Lives with:  Facility Resident Do you feel safe going back to the place where you live?  Yes Need for family participation in patient care:  Yes (Comment)  Care giving concerns:  TBD   Social Worker assessment / plan: [facility] and left message for son Cipriano BunkerFelipe Aguayo [phone] to obtain information on patient. LCSW collected information from son, His father has been at this facility for a while, they help him with all ADL's and is bed ridden. They do get him upright and put him in a chair/wheelchair, cant walk walk. He needs help with eating. He has a catheter for urinary retention. He has dementia but apparently will recognize his son. He is unable to speak and mumbles but his son can sometimes make out what he says. He sleeps a lot of the time now. He is incontinent. He is not on any special diet but needs assistance to eat.  Employment status:  Retired Health and safety inspectornsurance information:  [place]  Skilled Nursing Facility Information / Referral to community resources:  Skilled Nursing Facility  Patient/Family's Response  to care:  Call SON Lafayette DragonFelipe  if he/staff needs anything [phone]  Patient/Family's Understanding of and Emotional Response to Diagnosis, Current Treatment, and Prognosis: His father does not take much medicine and he has pulled  cathter out before  Emotional Assessment Appearance:  Appears stated age Attitude/Demeanor/Rapport:  Unable to Assess Affect (typically observed):  Unable to Assess Orientation:  Fluctuating Orientation (Suspected and/or reported Sundowners) Alcohol / Substance use:  Never Used Psych involvement (Current and /or in the community):  No (Comment)  Discharge Needs  Concerns to be addressed:  No discharge needs identified Readmission within the last 30 days:  No Current discharge risk:  Cognitively Impaired Barriers to Discharge:  Continued Medical Work up   FossBandi, Gypsumlaudine M, LCSW 08/23/2015, 3:11 PM

## 2015-08-23 NOTE — ED Notes (Signed)
Admitting MD at bedside.

## 2015-08-23 NOTE — Progress Notes (Signed)
LCSW called son Lafayette DragonFelipe [phone] and was able to complete assessment and fl2. [facility] Please contact patients son [phone]  Arrie SenateClaudine Noella Kipnis [phone]  Ramanda Paules LCSW

## 2015-08-24 DIAGNOSIS — E43 Unspecified severe protein-calorie malnutrition: Secondary | ICD-10-CM

## 2015-08-24 DIAGNOSIS — L8990 Pressure ulcer of unspecified site, unspecified stage: Secondary | ICD-10-CM

## 2015-08-24 LAB — CBC
HCT: 40.2 % (ref 40.0–52.0)
Hemoglobin: 12.9 g/dL — ABNORMAL LOW (ref 13.0–18.0)
MCH: 28.4 pg (ref 26.0–34.0)
MCHC: 32.2 g/dL (ref 32.0–36.0)
MCV: 88.2 fL (ref 80.0–100.0)
Platelets: 108 10*3/uL — ABNORMAL LOW (ref 150–440)
RBC: 4.56 MIL/uL (ref 4.40–5.90)
RDW: 17.9 % — AB (ref 11.5–14.5)
WBC: 8.8 10*3/uL (ref 3.8–10.6)

## 2015-08-24 LAB — BASIC METABOLIC PANEL
Anion gap: 9 (ref 5–15)
BUN: 38 mg/dL — AB (ref 6–20)
CHLORIDE: 122 mmol/L — AB (ref 101–111)
CO2: 22 mmol/L (ref 22–32)
Calcium: 8.5 mg/dL — ABNORMAL LOW (ref 8.9–10.3)
Creatinine, Ser: 1.16 mg/dL (ref 0.61–1.24)
GFR calc Af Amer: 59 mL/min — ABNORMAL LOW (ref >60)
GFR calc non Af Amer: 51 mL/min — ABNORMAL LOW (ref >60)
GLUCOSE: 111 mg/dL — AB (ref 65–99)
Potassium: 4 mmol/L (ref 3.5–5.1)
Sodium: 153 mmol/L — ABNORMAL HIGH (ref 135–145)

## 2015-08-24 MED ORDER — PIPERACILLIN-TAZOBACTAM 3.375 G IVPB
3.3750 g | Freq: Two times a day (BID) | INTRAVENOUS | Status: DC
  Administered 2015-08-24 – 2015-08-25 (×2): 3.375 g via INTRAVENOUS
  Filled 2015-08-24 (×3): qty 50

## 2015-08-24 NOTE — Clinical Documentation Improvement (Signed)
Internal Medicine  A cause and effect relationship may not be assumed and must be documented by a provider.  Please clarify the relationship, if any, between UTI  and Suprapubic catheter.  Are the conditions:   UTI  due to or associated with Suprapubic Catheter  UTI unrelated to Suprapubic Catheter  Other  Clinically Undetermined   Supporting Information:  Malnutrition, S/p catheter pulled out  Please exercise your independent, professional judgment when responding. A specific answer is not anticipated or expected.   Thank You,  Lavonda JumboLawanda J Emery Dupuy Health Information Management [place] [phone]

## 2015-08-24 NOTE — Plan of Care (Signed)
Problem: Education: Goal: Knowledge of [facility] Education information/materials will improve Outcome: Not Progressing dementia  Problem: Nutrition: Goal: Adequate nutrition will be maintained Outcome: Not Progressing Not alert enough to take in nutrition

## 2015-08-24 NOTE — Progress Notes (Signed)
Initial Nutrition Assessment  DOCUMENTATION CODES:   Severe malnutrition in context of chronic illness  INTERVENTION:   -RD notes SLP consult to evaluate, pt currently on dysphagia I with thin liquids; will follow -RD notes Prostat ordered daily -Recommend Mighty Shakes on meal trays TID and Magic Cup BID for added nutrition (each supplement provides approximately 300kcals and 9g protein) -Will order calorie count per consult request. Instructions for Nsg in order; CNA, RN aware. Will follow and calculate as able.   NUTRITION DIAGNOSIS:   Malnutrition related to chronic illness as evidenced by percent weight loss, severe depletion of muscle mass, severe depletion of body fat.  GOAL:   Patient will meet greater than or equal to 90% of their needs  MONITOR:   PO intake, Labs, Weight trends, I & O's, Skin, Supplement acceptance  REASON FOR ASSESSMENT:   Consult Poor PO  ASSESSMENT:   Pt admitted with chronic urinary retention with suprapubic catheter sent in from [facility] after patient pulled out suprapubic catheter. Pt with h/o dementia and lower extremity contractures.  Past Medical History  Diagnosis Date  . Depression   . Hypertension   . Coronary artery disease   . Chronic kidney disease     Diet Order:  DIET - DYS 1 Room service appropriate?: Yes; Fluid consistency:: Thin   Per Nsg note yesterday, pt too lethargic to take nutrition. Pt breakfast at bedside untouched. Per CNA this morning, pt attempted to be fed but pt would not take any.   Unable to clarify po intake PTA with pt. No family at bedside.   Medications: colace, MVI, D5 at 15400mL/hr (providing 408kcals in 24hours) Labs: Na 153, 156 on admission   Gastrointestinal Profile: Last BM:  08/24/2015   Nutrition-Focused Physical Exam Findings: Nutrition-Focused physical exam completed. Findings are severe fat depletion, severe muscle depletion, and no edema.    Weight Change: Per [facility] weight  encounters pt with 33% weight loss in 2 months   Skin:   (RD notes unstageable heel pressure ulcer on 08/16/2015)   Height:   Ht Readings from Last 1 Encounters:  08/24/15 5\' 2"  (1.575 m)    Weight:   Wt Readings from Last 1 Encounters:  08/24/15 78 lb 1.6 oz (35.426 kg)   Wt Readings from Last 10 Encounters:  08/24/15 80 lb 9.6 oz (36.56 kg)  06/17/15 120 lb (54.432 kg)    Ideal Body Weight:   50kg  BMI:  Body mass index is 14.28 kg/(m^2).  Estimated Nutritional Needs:   Kcal:  1250-1500kcals, using IBW of 50kg  Protein:  50-60g protein  Fluid:  >1.3L fluid  EDUCATION NEEDS:   No education needs identified at this time  Leda QuailAllyson Mariyah Upshaw, RD, LDN Pager [phone] Weekend/On-Call Pager [phone]

## 2015-08-24 NOTE — Care Management (Signed)
Discussed Mr. Gary Fisher's discharge plans with Dr. Winona Fisher. Son would like to discuss going back to [facility] with Hospice services in place vs Hospice Home. Spoke with son, Cipriano BunkerFelipe Fisher at the bedside. States that there are 3 sons. The other 2 sons have ask him to make all the decisions.  Discussed Hospice Home vs [name] with Hospice services. Gary Fisher stated that he would like his [facility]. Gary BarkerKaren Robertson, RN representative for [facility] updated. Dr. Winona Fisher updated. Gary GreetBrenda s Katina Remick RN MSN CCM [facility] Management [phone]

## 2015-08-24 NOTE — Progress Notes (Deleted)
      I met with patient's son , Mr. Larey Dresser to discuss patient's medical condition. Patient's son agreed this patient is in poor health and declining precipitously over the past 5 months. Multiple medical issues were discussed including malnutrition, pressure ulcerations, aspiration risks, dehydration. All questions were answered. Patient's son voiced understanding. He will be main person to talk if anything changes in patient's medical condition. I also discussed CODE STATUS and the patient's son requested patient to be DO NOT RESUSCITATE. Orders are entered into the chart. Patient's son requested hospice liason to discuss further care, including comfort care for his father. Care management was contacted with patient's family desire to discuss comfort care measures.    Time spent 20 minutes

## 2015-08-24 NOTE — Clinical Social Work Note (Signed)
[facility]). [facility]. CSW will continue to follow.   Dede QuerySarah Senia Even, MSW, LCSW  Clinical Social Worker  [phone]

## 2015-08-24 NOTE — Clinical Documentation Improvement (Signed)
Internal Medicine  Can the diagnosis of pressure ulcer be further specified by site and stage ? Thank you    Document Site with laterality - Elbow, Back (upper/lower), Sacral, Hip, Buttock, Ankle, Heel, Head, Other (Specify)  Pressure Ulcer Stage - Stage1, Stage 2, Stage 3, Stage 4, Unstageable, Unspecified, Unable to Clinically Determine  Other  Clinically Undetermined    Supporting Information: doc on hospital problem list , nurse's physical assessment   Please exercise your independent, professional judgment when responding. A specific answer is not anticipated or expected.   Thank You,  Lavonda JumboLawanda J Brenley Priore Health Information Management [place] [phone]

## 2015-08-24 NOTE — Progress Notes (Signed)
Speech Therapy Note: received order, reviewed chart notes. Consulted NSG re: pt's status today; NSG has held po's thus far today including meds in puree. NSG reported pt remains restless, poorly responsive and does not open eyes or engage w/ NSG; the same was noted by SLP upon entering room and attempting to assess pt/situation. Pt did not attempt to engage, nonverbal, and appeared agitated when touched. After discussing w/ NSG further, it was agreed that pt is not appropriate for BSE at this time and that any po's will be given only when NSG has fully assessed pt's alertness for safety w/ such otherwise hold po's as NSG has done today so far. Pt has dx of severe Dementia, severe malnutrition, and hypernatremia. A palliative care consult is ordered w/ consideration for Hospice services. ST will f/u tomorrow for ongoing assessment. NSG agreed; no family present.

## 2015-08-24 NOTE — Progress Notes (Addendum)
[facility] - [place] at [facility]   PATIENT NAME: Gary Fisher    MR#:  [id number]  DATE OF BIRTH:  November 12, 1917  SUBJECTIVE:  CHIEF COMPLAINT:  No chief complaint on file. The patient is is 82 year old male with past history significant for history of multiple medical problems including dementia, hypertension, depression, crying. Her retention with suprapubic catheter who was sent from [facility] after he pulled a suprapubic catheter. Patient was found to be very malnourished having contractures in lower extremities and restless, agitated, nonverbal. Unable to get any review of systems from the patient. Patient's labs reveal severe hypernatremia with sodium level of 156, patient was initiated on D5 water solution and his sodium level improved to 153 today.  Review of Systems  Unable to perform ROS: dementia    VITAL SIGNS: Blood pressure 114/65, pulse 89, temperature 98.1 F (36.7 C), temperature source Oral, resp. rate 14, height  (1.575 m), weight 35.426 kg (78 lb 1.6 oz), SpO2 94 %.  PHYSICAL EXAMINATION:   GENERAL:  82 y.o.-year-old patient lying in the bed in mild-to-moderate distress, restless, picking on  things.  EYES: Pupils equal, round, reactive to light and accommodation. No scleral icterus. Extraocular muscles intact.  HEENT: Head atraumatic, normocephalic. Oropharynx and nasopharynx clear. Oral mucosa is dry NECK:  Supple, no jugular venous distention. No thyroid enlargement, no tenderness.  LUNGS: Normal breath sounds bilaterally, no wheezing, rales,rhonchi or crepitation. No use of accessory muscles of respiration.  CARDIOVASCULAR: S1, S2 normal. No murmurs, rubs, or gallops.  ABDOMEN: Soft, nontender, nondistended. Bowel sounds present. No organomegaly or mass.  EXTREMITIES: No pedal edema, cyanosis, or clubbing. Skin reveals dressed lacerations in lumbosacral area, left anterior shin, left heel. Patient has severe  contractures in lower extremities and it's difficult to extend his lower extremities, he is basically in fetal position  NEUROLOGIC: Cranial nerves II through XII are intact. Muscle strength 5/5 in all extremities. Sensation intact. Gait not checked.  PSYCHIATRIC: The patient is alertnot oriented, nonverbal, not responding to stimuli  SKIN: No obvious rash, lesion, or ulcer. Dressed areas of ulcerations in sacral area, left anterior shin, left heel  ORDERS/RESULTS REVIEWED:   CBC  Recent Labs Lab 08/23/15 0952 08/24/15 0408  WBC 10.0 8.8  HGB 13.6 12.9*  HCT 42.6 40.2  PLT 121* 108*  MCV 87.8 88.2  MCH 28.1 28.4  MCHC 32.0 32.2  RDW 17.7* 17.9*  LYMPHSABS 3.6  --   MONOABS 0.7  --   EOSABS 0.4  --   BASOSABS 0.0  --    ------------------------------------------------------------------------------------------------------------------  Chemistries   Recent Labs Lab 08/23/15 0952 08/24/15 0408  NA 156* 153*  K 4.7 4.0  CL 121* 122*  CO2 25 22  GLUCOSE 92 111*  BUN 46* 38*  CREATININE 1.16 1.16  CALCIUM 8.9 8.5*  AST 31  --   ALT 14*  --   ALKPHOS 73  --   BILITOT 0.7  --    ------------------------------------------------------------------------------------------------------------------ estimated creatinine clearance is 18.6 mL/min (by C-G formula based on Cr of 1.16). ------------------------------------------------------------------------------------------------------------------ No results for input(s): TSH, T4TOTAL, T3FREE, THYROIDAB in the last 72 hours.  Invalid input(s): FREET3  Cardiac Enzymes No results for input(s): CKMB, TROPONINI, MYOGLOBIN in the last 168 hours.  Invalid input(s): CK ------------------------------------------------------------------------------------------------------------------ Invalid input(s): POCBNP ---------------------------------------------------------------------------------------------------------------  RADIOLOGY: No  results found.  EKG: No orders found for this or any previous visit.  ASSESSMENT AND PLAN:  Active Problems:   Acute hypernatremia  Pressure ulcer   Protein-calorie malnutrition, severe #1. Hypernatremia due to free water deficit , continue D5 water, sodium level has been improving. Follow patient's oral intake, get dietary consultation and the calorie count. Get palliative care involved for further recommendations, recommend hospice home for this patient #2. Thrombocytopenia, seems to be chronic, follow closely, discontinue anticoagulants. If patient's platelet count dropped down even lower #3. Severe protein calorie malnutrition, dietary consultation and calorie count is ordered, pending results #4. Functional quadriplegia due to severe dementia, palliative care consultation is obtained, recommended hospice home #5. Complicated urinary tract infection, continue patient on Zosyn for now. Awaiting for urinary cultures. Urologist consultation is appreciated, recommended to continue Foley catheter if patient's blood expected since he is only few weeks, however, if life expectancy is more than few months, interventional radiologist consultation is recommended to suprapubic catheter reestablishment. Management plans discussed with the patient, family and they are in agreement.   DRUG ALLERGIES: No Known Allergies  CODE STATUS:     Code Status Orders        Start     Ordered   08/23/15 1250  Full code   Continuous     08/23/15 1252    Code Status History    Date Active Date Inactive Code Status Order ID Comments User Context   This patient has a current code status but no historical code status.      TOTAL Critical care TIME TAKING CARE OF THIS PATIENT: 40  minutes.    Katharina CaperVAICKUTE,Jaryn Hocutt M.D on 08/24/2015 at 11:36 AM  Between 7am to 6pm - Pager - [phone]  After 6pm go to [email or web address] - password [email or web address] [facility]  [facility]  Office  [phone]  CC: Primary care  physician; Derwood KaplanEason,  Ernest B, MD

## 2015-08-24 NOTE — Progress Notes (Signed)
Pharmacy Antibiotic Note  Gary Fisher is a 82 y.o. male admitted on 08/23/2015 with UTI.  Pharmacy has been consulted for Zosyn dosing.  Plan: The dose of Zosyn will be adjusted to 3.375g IV q12h based on renal function.  Height: 5\' 2"  (157.5 cm) Weight: 78 lb 1.6 oz (35.426 kg) IBW/kg (Calculated) : 54.6  Temp (24hrs), Avg:98 F (36.7 C), Min:97.6 F (36.4 C), Max:98.3 F (36.8 C)   Recent Labs Lab 08/23/15 0952 08/24/15 0408  WBC 10.0 8.8  CREATININE 1.16 1.16    Estimated Creatinine Clearance: 18.6 mL/min (by C-G formula based on Cr of 1.16).    No Known Allergies  Antimicrobials this admission: Zosyn 4/30 >>    Dose adjustments this admission: 5/1 Zosyn decreased to 3.375g IV q12h  Microbiology results: 4/30 UCx: pending  4/30 MRSA PCR: negative  Thank you for allowing pharmacy to be a part of this patient's care.  Clovia CuffLisa Dimitra Woodstock, PharmD, BCPS 08/24/2015 10:45 AM

## 2015-08-24 NOTE — Progress Notes (Signed)
[place] referral received from [place]. Gary Fisher was admitted to [facility] on 4/30 with hypernatremia. He has a PMH of dementia, HTN, CKD, depression and CAD. He was admitted from [facility] where he has been for the last 3 weeks under long term care. Prior to that he was at his son's home. Per report of his son Gary Fisher, Gary Fisher has been nonverbal for the past 10 months.  Patient sen lying in bed, extremities contracted, appears very cachectic. Staff reports patient is not eating or drinking. He has not improved with IV hydration. Attending physician Dr. Ether Griffins has spoken to patient's son Gary Fisher and he has chosen to pursue comfort and end of life care at the hospice home. Patient is a Education administrator met in the patient's room with his son Gary Fisher,  who declined the use of an interpreter, to initiate education regarding hospice services, philosophy and team approach to care with understanding voiced. Consents signed, questions answered. [facility] cared for patient's wife at their home in [place] 5 years ago, so Gary Fisher reports he is familiar with hospice services. Patient information faxed to referral.  Gary Fisher requests that writer contact him tomorrow to notify him when his father transfers to the hospice home as he will be at work. Plan is for patient to transfer to the hospice home tomorrow via EMS with signed portable DNR in place. Hospital care team all aware. Writer to follow up with report to the Hospice home and EMS notification. Thank you for the opportunity to be involved in the care of this patient and his family. Flo Shanks RN, BSN, [place] [facility] [place], [facility] [phone] c

## 2015-08-25 DIAGNOSIS — F03918 Unspecified dementia, unspecified severity, with other behavioral disturbance: Secondary | ICD-10-CM

## 2015-08-25 DIAGNOSIS — R1310 Dysphagia, unspecified: Secondary | ICD-10-CM

## 2015-08-25 DIAGNOSIS — R532 Functional quadriplegia: Secondary | ICD-10-CM

## 2015-08-25 DIAGNOSIS — R339 Retention of urine, unspecified: Secondary | ICD-10-CM

## 2015-08-25 DIAGNOSIS — R6251 Failure to thrive (child): Secondary | ICD-10-CM

## 2015-08-25 DIAGNOSIS — D696 Thrombocytopenia, unspecified: Secondary | ICD-10-CM

## 2015-08-25 DIAGNOSIS — M24562 Contracture, left knee: Secondary | ICD-10-CM

## 2015-08-25 DIAGNOSIS — I959 Hypotension, unspecified: Secondary | ICD-10-CM

## 2015-08-25 DIAGNOSIS — F0391 Unspecified dementia with behavioral disturbance: Secondary | ICD-10-CM

## 2015-08-25 DIAGNOSIS — M24561 Contracture, right knee: Secondary | ICD-10-CM

## 2015-08-25 DIAGNOSIS — N390 Urinary tract infection, site not specified: Secondary | ICD-10-CM

## 2015-08-25 MED ORDER — MORPHINE SULFATE (CONCENTRATE) 10 MG/0.5ML PO SOLN: 5.0000 mg | ORAL | Status: AC | PRN

## 2015-08-25 NOTE — Discharge Summary (Signed)
[facility] - [place] at [facility]   PATIENT NAME: Gary Fisher    MR#:  [id number]  DATE OF BIRTH:  1917-06-12  DATE OF ADMISSION:  08/23/2015 ADMITTING PHYSICIAN: Katha Hamming, MD  DATE OF DISCHARGE: No discharge date for patient encounter.  PRIMARY CARE PHYSICIAN: Derwood Kaplan, MD     ADMISSION DIAGNOSIS:  Hypernatremia [E87.0] Dehydration, severe [E86.0]  DISCHARGE DIAGNOSIS:  Principal Problem:   Acute hypernatremia Active Problems:   Pressure ulcer   Protein-calorie malnutrition, severe   Functional quadriplegia (HCC)   Dysphagia   Dementia with behavioral disturbance   Failure to thrive (child)   Contractures involving both knees   Urinary retention   Hypotension   Thrombocytopenia (HCC)   SECONDARY DIAGNOSIS:   Past Medical History  Diagnosis Date  . Depression   . Hypertension   . Coronary artery disease   . Chronic kidney disease     .pro HOSPITAL COURSE:  The patient is is 82 year old male with past history significant for history of multiple medical problems including dementia, hypertension, depression, urinary retention with suprapubic catheter who was sent from [facility] after he pulled a suprapubic catheter. Patient was found to be very malnourished having contractures in lower extremities and restless, agitated, nonverbal. Unable to get any review of systems from the patient. Patient's labs reveal severe hypernatremia with sodium level of 156, patient was initiated on D5 water solution and his sodium level improved to 153. I discussed patient's case with his son, power of attorney for medical care since he had numerous diagnosis which were of sufficient risk with focus discussion on advance care planning in order to allow ration and family to thoughtful. He consider personal goals of care. Patient's son , [name] was present during my discussions and decided on comfort care measures and   Initiate DO NOT RESUSCITATE orders. Care management. Discussed this patient's family and patient's son decided on hospice home placement, where patient will be discharged today. Discussion by problem: #1. Hypernatremia due to free water deficit , patient was continued on D5 water while in the hospital and his sodium level somewhat improved, however, patient's oral intake remained very poor, negligent. There was a significant concern giving him anything orally due to risks of aspiration. I personally discussed patient's case with his son who decided on comfort care measures and hospice home placement. Patient is DO NOT RESUSCITATE per patient's son's request #2. Thrombocytopenia, seems to be chronic, no intervention  #3. Severe protein calorie malnutrition, no intervention, supportive therapy #4. Functional quadriplegia due to severe dementia, transfering to  hospice home today.  #5. Complicated urinary tract infection, patient was initiated on  Zosyn while in the hospital, however, patient's family decided on comfort care measures and the patient will be discharged to hospice home today. Appreciate neurology consultation. We'll continue Foley catheter for end-of-life care  DISCHARGE CONDITIONS:   Terminal  CONSULTS OBTAINED:  Treatment Team:  Vanna Scotland, MD  DRUG ALLERGIES:  No Known Allergies  DISCHARGE MEDICATIONS:   Current Discharge Medication List    START taking these medications   Details  Morphine Sulfate (MORPHINE CONCENTRATE) 10 MG/0.5ML SOLN concentrated solution Take 0.25 mLs (5 mg total) by mouth every 3 (three) hours as needed for moderate pain, severe pain, anxiety or shortness of breath. Qty: 30 mL, Refills: 0      CONTINUE these medications which have NOT CHANGED   Details  Amino Acids-Protein Hydrolys (FEEDING SUPPLEMENT, PRO-STAT SUGAR FREE  64,) LIQD Take 30 mLs by mouth daily.      STOP taking these medications     aspirin EC 81 MG tablet      isosorbide  dinitrate (ISORDIL) 30 MG tablet      mirtazapine (REMERON) 15 MG tablet      Multiple Vitamins-Minerals (MULTIVITAMIN WITH MINERALS) tablet      tamsulosin (FLOMAX) 0.4 MG CAPS capsule          DISCHARGE INSTRUCTIONS:    No follow-up is recommended  If you experience worsening of your admission symptoms, develop shortness of breath, life threatening emergency, suicidal or homicidal thoughts you must seek medical attention immediately by calling 911 or calling your MD immediately  if symptoms less severe.  You Must read complete instructions/literature along with all the possible adverse reactions/side effects for all the Medicines you take and that have been prescribed to you. Take any new Medicines after you have completely understood and accept all the possible adverse reactions/side effects.   Please note  You were cared for by a hospitalist during your hospital stay. If you have any questions about your discharge medications or the care you received while you were in the hospital after you are discharged, you can call the unit and asked to speak with the hospitalist on call if the hospitalist that took care of you is not available. Once you are discharged, your primary care physician will handle any further medical issues. Please note that NO REFILLS for any discharge medications will be authorized once you are discharged, as it is imperative that you return to your primary care physician (or establish a relationship with a primary care physician if you do not have one) for your aftercare needs so that they can reassess your need for medications and monitor your lab values.    Today   CHIEF COMPLAINT:  No chief complaint on file.   HISTORY OF PRESENT ILLNESS:  Gary Fisher  is a 82 y.o. male with a known history of multiple medical problems including dementia, hypertension, depression, urinary retention with suprapubic catheter who was sent from [facility]  home after he pulled a suprapubic catheter. Patient was found to be very malnourished having contractures in lower extremities and restless, agitated, nonverbal. Unable to get any review of systems from the patient. Patient's labs reveal severe hypernatremia with sodium level of 156, patient was initiated on D5 water solution and his sodium level improved to 153. I discussed patient's case with his son, power of attorney for medical care since he had numerous diagnosis which were of sufficient risk with focus discussion on advance care planning in order to allow ration and family to thoughtful. He consider personal goals of care. Patient's son , [name] was present during my discussions and decided on comfort care measures and  Initiate DO NOT RESUSCITATE orders. Care management. Discussed this patient's family and patient's son decided on hospice home placement, where patient will be discharged today. Discussion by problem: #1. Hypernatremia due to free water deficit , patient was continued on D5 water while in the hospital and his sodium level somewhat improved, however, patient's oral intake remained very poor, negligent. There was a significant concern giving him anything orally due to risks of aspiration. I personally discussed patient's case with his son who decided on comfort care measures and hospice home placement. Patient is DO NOT RESUSCITATE per patient's son's request #2. Thrombocytopenia, seems to be chronic, no intervention  #3. Severe protein calorie malnutrition,  no intervention, supportive therapy #4. Functional quadriplegia due to severe dementia, transfering to  hospice home today.  #5. Complicated urinary tract infection, patient was initiated on  Zosyn while in the hospital, however, patient's family decided on comfort care measures and the patient will be discharged to hospice home today. Appreciate neurology consultation. We'll continue Foley catheter for end-of-life  care    VITAL SIGNS:  Blood pressure 96/57, pulse 79, temperature 97.8 F (36.6 C), temperature source Oral, resp. rate 14, height 5\' 2"  (1.575 m), weight 36.832 kg (81 lb 3.2 oz), SpO2 100 %.  I/O:   Intake/Output Summary (Last 24 hours) at 08/25/15 1035 Last data filed at 08/24/15 1723  Gross per 24 hour  Intake   2816 ml  Output    200 ml  Net   2616 ml    PHYSICAL EXAMINATION:  GENERAL:  82 y.o.-year-old patient lying in the bed in moderate distress, curled in the bed, tremulous, nonreactive to pain or verbal stimuli.  EYES: Pupils equal, round, reactive to light and accommodation. No scleral icterus. Extraocular muscles intact.  HEENT: Head atraumatic, normocephalic. Oropharynx and nasopharynx clear, dry oral mucosa.  NECK:  Supple, no jugular venous distention. No thyroid enlargement, no tenderness.  LUNGS: Diminished breath sounds bilaterally, no wheezing, rales,rhonchi or crepitation. Intermittent use of accessory muscles of respiration.  CARDIOVASCULAR: S1, S2 normal. No murmurs, rubs, or gallops.  ABDOMEN: Soft, non-tender, non-distended. Bowel sounds present. No organomegaly or mass.  EXTREMITIES: No pedal edema, cyanosis, or clubbing. Multiple ulcerations on the right side, including right shin and right heel, dressed NEUROLOGIC: Cranial nerves II through XII are intact. Muscle strength 5/5 in all extremities. Sensation intact. Gait not checked. Significantly increased flexor tone in all 4 extremities PSYCHIATRIC: The patient is alert , not able to assess orientation as patient is not verbally responsive  SKIN: No obvious rash, lesion, or ulcer about from above.   DATA REVIEW:   CBC  Recent Labs Lab 08/24/15 0408  WBC 8.8  HGB 12.9*  HCT 40.2  PLT 108*    Chemistries   Recent Labs Lab 08/23/15 0952 08/24/15 0408  NA 156* 153*  K 4.7 4.0  CL 121* 122*  CO2 25 22  GLUCOSE 92 111*  BUN 46* 38*  CREATININE 1.16 1.16  CALCIUM 8.9 8.5*  AST 31  --    ALT 14*  --   ALKPHOS 73  --   BILITOT 0.7  --     Cardiac Enzymes No results for input(s): TROPONINI in the last 168 hours.  Microbiology Results  Results for orders placed or performed during the hospital encounter of 08/23/15  Urine culture     Status: None (Preliminary result)   Collection Time: 08/23/15 12:22 PM  Result Value Ref Range Status   Specimen Description URINE, RANDOM  Final   Special Requests Normal  Final   Culture HOLDING FOR POSSIBLE PATHOGEN  Final   Report Status PENDING  Incomplete  MRSA PCR Screening     Status: None   Collection Time: 08/23/15  3:36 PM  Result Value Ref Range Status   MRSA by PCR NEGATIVE NEGATIVE Final    Comment:        The GeneXpert MRSA Assay (FDA approved for NASAL specimens only), is one component of a comprehensive MRSA colonization surveillance program. It is not intended to diagnose MRSA infection nor to guide or monitor treatment for MRSA infections.     RADIOLOGY:  No results found.  EKG:  No orders found for this or any previous visit.    Management plans discussed with the patient, family and they are in agreement.  CODE STATUS:     Code Status Orders        Start     Ordered   08/24/15 1534  Do not attempt resuscitation (DNR)   Continuous    Question Answer Comment  In the event of cardiac or respiratory ARREST Do not call a "code blue"   In the event of cardiac or respiratory ARREST Do not perform Intubation, CPR, defibrillation or ACLS   In the event of cardiac or respiratory ARREST Use medication by any route, position, wound care, and other measures to relive pain and suffering. May use oxygen, suction and manual treatment of airway obstruction as needed for comfort.      08/24/15 1534    Code Status History    Date Active Date Inactive Code Status Order ID Comments User Context   08/23/2015 12:52 PM 08/24/2015  3:34 PM Full Code [id number]  Katha Hamming, MD ED      TOTAL TIME TAKING  CARE OF THIS PATIENT: 40 minutes.    Katharina Caper M.D on 08/25/2015 at 10:35 AM  Between 7am to 6pm - Pager - [phone]  After 6pm go to [email or web address] - password [email or web address] [facility]  [place] [facility]  Office  [phone]  CC: Primary care physician; Derwood Kaplan, MD

## 2015-08-25 NOTE — Progress Notes (Signed)
Advance care planning note     Date of discussion 08/24/2015 Active diagnoses: Acute hypernatremia. Pressure ulcerations. Protein calorie malnutrition, severe. Functional quadriplegia Dysphagia. Dementia with behavioral disturbance. Mutism. Failure to thrive adult. Infection of urinary tract tract. Contractures involving both knees. Urinary retention requiring indwelling for a catheter or suprapubic catheter, status post recent suprapubic catheter removal. Thrombocytopenia. These as a diagnosis as sufficient risk that focused discussion on advance care planning was indicated in order to allow the patient's family to the family consider personal goals of care, and if sedation arise that prevent debility to personally give input, to ensure appropriate. A presentation of the personal desires to documentation, all inform surrogate decision maker is which was designated as patient's son, Mr. Gary Fisher   Discussion:: I have met this patient's son, Mr.Aguayo to discuss patient's condition extensively, we discussed with this condition leads to physical deterioration and multiorgan failure and ultimately to death. The patient's family agreed, patient's son did not want his father to suffer and made a decision for hospice home placement. We discussed also CODE STATUS and patient's son decided on comfort care measures and initiated DO NOT RESUSCITATE order.     Persons present and participating in discussion: DR Ether Griffins , MD Mr. Gary Fisher, patient's son    Total time spent face-to-face in education and discussion directly related to advanced care planning 20 minutes

## 2015-08-25 NOTE — Progress Notes (Signed)
Follow up visit made to new hospice home referral. Patient seen lying in bed, appears restless. Staff RN Trey PaulaJeff notified and will follow up with PRN pain medication. Plan remains for patient to discharge to the hospice home today for end of life care and pain management. Several calls to [facility] regarding transfer paper work as patient [facility]. Hospital care team made aware of need for later discharge. Report called to the hospice home, EMS notified for transport. Signed DNR in place in discharge packet. Thank you. Dayna BarkerKaren Robertson RN, BSN, [facility], [facility] [phone] c

## 2015-08-25 NOTE — Clinical Social Work Note (Signed)
Pt is ready for discharge today and will go [facility], once discharge paperwork is signed by pt's son. Currently, pt is open to another Hospice agency and pt needs to be discharged from the hospice agency prior to go to [facility]. Hospital liaison is working on addressing this. CSW prepared discharge paperwork. CSW is signing off as no further needs identified.   Dede QuerySarah Jaquavius Hudler, MSW, LCSW  Clinical Social Worker  [phone]

## 2015-08-26 LAB — URINE CULTURE
Culture: >=100000 — AB
Special Requests: NORMAL

## 2015-09-24 DEATH — deceased

## 2016-10-29 IMAGING — XA IR CATHETER TUBE CHANGE
3 series · 3 of 3 positions shown · non-contrast
Comparison: none

INDICATION: [AGE] male with urinary retention and a suprapubic catheter.
Patient currently has a 12 French multipurpose drainage catheter
which was placed on 06/17/2015.

[Series 1: fl - angio · 1 of 1 slices shown (1 of 3)]
[im 1/1]
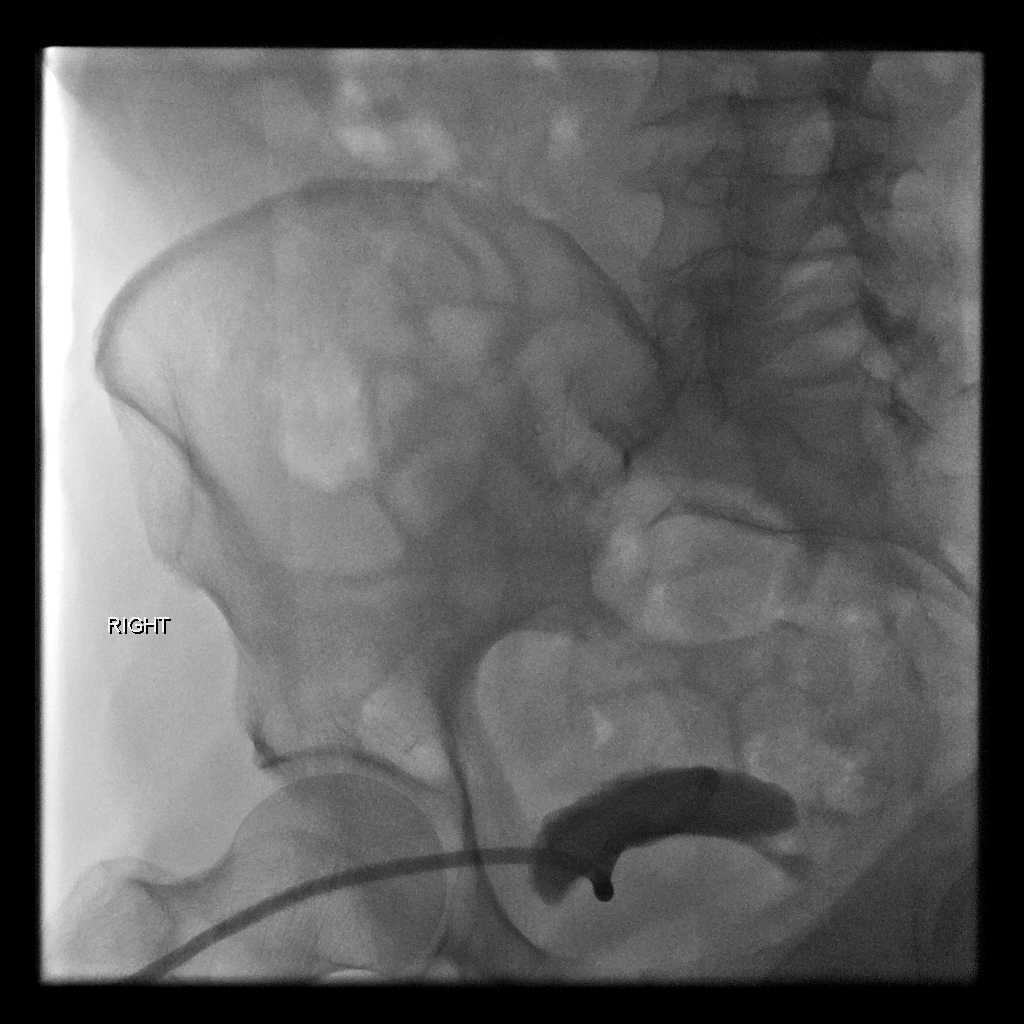

[Series 2: fl - angio · 1 of 1 slices shown (2 of 3)]
[im 1/1]
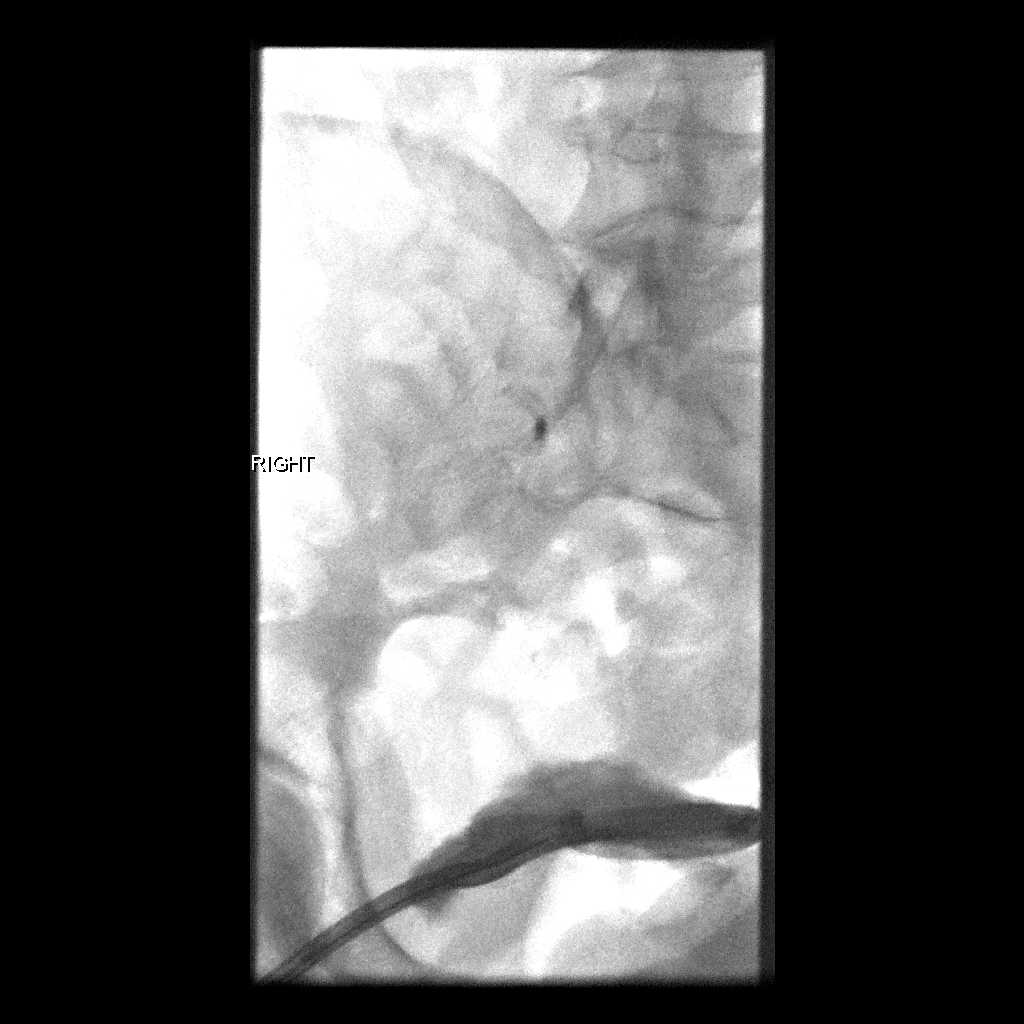

[Series 3: fl - angio · 1 of 1 slices shown (3 of 3)]
[im 1/1]
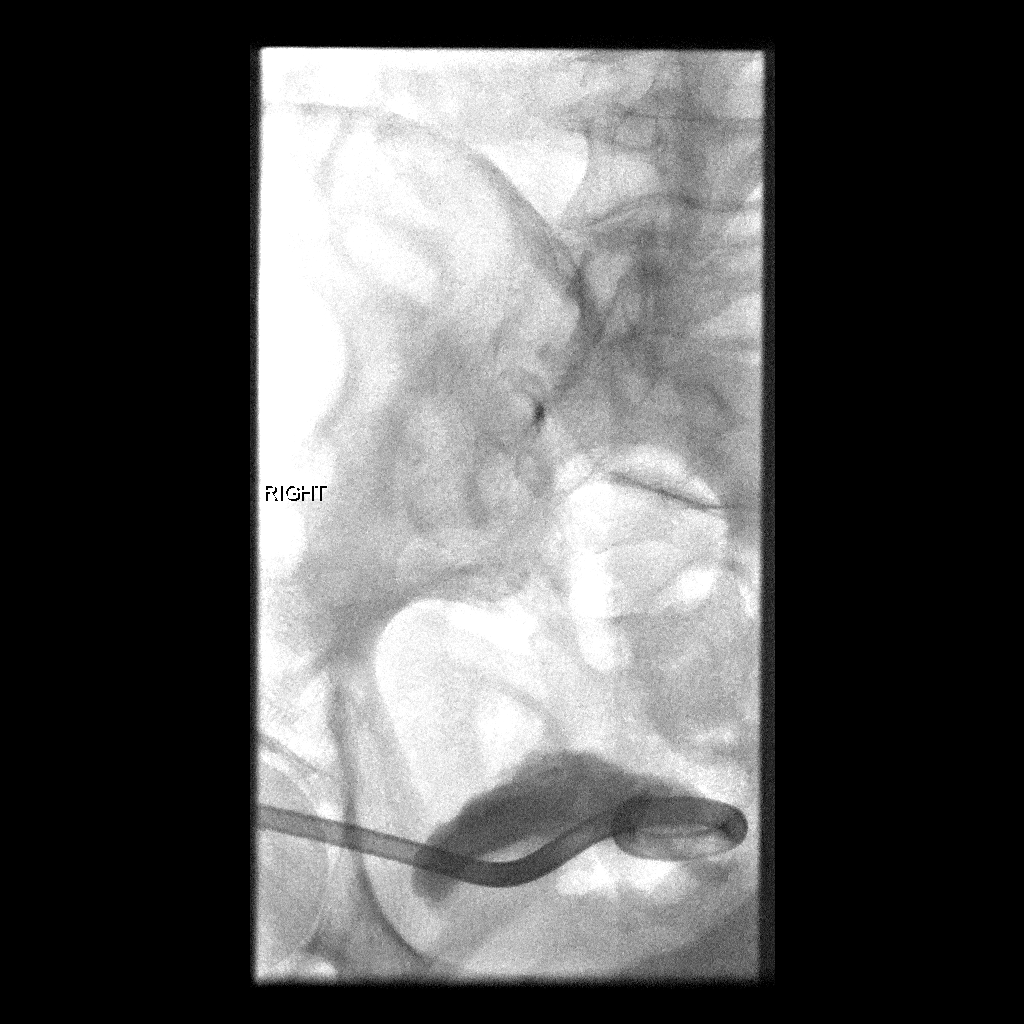

[3 of 3 positions shown; findings below may reference images not displayed]

EXAM:
IR CATHETER TUBE CHANGE

MEDICATIONS:
The patient is currently admitted to the hospital and receiving
intravenous antibiotics. The antibiotics were administered within an
appropriate time frame prior to the initiation of the procedure.

ANESTHESIA/SEDATION:
None

COMPLICATIONS:
None immediate.

PROCEDURE:
Informed written consent was obtained from the patient after a
thorough discussion of the procedural risks, benefits and
alternatives. All questions were addressed. Maximal Sterile Barrier
Technique was utilized including caps, mask, sterile gowns, sterile
gloves, sterile drape, hand hygiene and skin antiseptic. A timeout
was performed prior to the initiation of the procedure.

A hand injection of contrast under fluoroscopic imaging confirms the
12 French multipurpose drainage catheter is within the bladder. The
catheter was cut and removed over an Amplatz wire. The skin tract
was then serially dilated to 16 French. Eliseo Jeudy 16 French
multipurpose drain was advanced over the wire and positioned within
the bladder. Final position was confirmed by a fluoroscopic spot
image.

The catheter was secured to the skin with 0 Prolene suture.
IMPRESSION: Successful exchange and up size of suprapubic catheter to a 16
French multipurpose drain.

PLAN:
Return Interventional Radiology in 1 month for exchange to a 16 or
18 French Foley catheter.
